# Patient Record
Sex: Male | Born: 1980 | Race: White | Hispanic: No | Marital: Single | Smoking: Current every day smoker
Health system: Southern US, Community
[De-identification: ages and names within clinical notes are randomized; demographics above are authoritative.]

---

## 2016-09-30 ENCOUNTER — Encounter (HOSPITAL_COMMUNITY): Payer: Self-pay

## 2016-09-30 ENCOUNTER — Emergency Department (HOSPITAL_COMMUNITY): Payer: Self-pay

## 2016-09-30 ENCOUNTER — Emergency Department (HOSPITAL_COMMUNITY)
Admission: EM | Admit: 2016-09-30 | Discharge: 2016-09-30 | Disposition: A | Discharge status: Home / Self Care | Payer: Self-pay | Attending: Emergency Medicine | Admitting: Emergency Medicine

## 2016-09-30 DIAGNOSIS — S46912A Strain of unspecified muscle, fascia and tendon at shoulder and upper arm level, left arm, initial encounter: Secondary | ICD-10-CM | POA: Insufficient documentation

## 2016-09-30 DIAGNOSIS — S60221A Contusion of right hand, initial encounter: Secondary | ICD-10-CM | POA: Insufficient documentation

## 2016-09-30 DIAGNOSIS — F17200 Nicotine dependence, unspecified, uncomplicated: Secondary | ICD-10-CM | POA: Insufficient documentation

## 2016-09-30 DIAGNOSIS — Y999 Unspecified external cause status: Secondary | ICD-10-CM | POA: Insufficient documentation

## 2016-09-30 DIAGNOSIS — Y92008 Other place in unspecified non-institutional (private) residence as the place of occurrence of the external cause: Secondary | ICD-10-CM | POA: Insufficient documentation

## 2016-09-30 DIAGNOSIS — Y9389 Activity, other specified: Secondary | ICD-10-CM | POA: Insufficient documentation

## 2016-09-30 DIAGNOSIS — W1789XA Other fall from one level to another, initial encounter: Secondary | ICD-10-CM | POA: Insufficient documentation

## 2016-09-30 MED ORDER — TRAMADOL HCL 50 MG PO TABS: 50.0000 mg | ORAL_TABLET | Freq: Four times a day (QID) | ORAL | 0 refills | Status: AC | PRN

## 2016-09-30 MED ORDER — OXYCODONE-ACETAMINOPHEN 5-325 MG PO TABS
1.0000 | ORAL_TABLET | Freq: Once | ORAL | Status: AC
  Administered 2016-09-30: 1 via ORAL
  Filled 2016-09-30: qty 1

## 2016-09-30 MED ORDER — OXYCODONE-ACETAMINOPHEN 5-325 MG PO TABS: 1.0000 | ORAL_TABLET | Freq: Once | ORAL | Status: DC

## 2016-09-30 NOTE — ED Provider Notes (Signed)
[facility]EM[facility] DEPT Provider Note   CSN: [id number] Arrival date & time: 09/30/16  0429     History   Chief Complaint Chief Complaint  Patient presents with  . Fall    shoulder/hand pain    HPI Gerald Hernandez is a 36 y.o. male.  Patient presents to the emergency department for evaluation of injuries from a fall. Patient reports that he was on his porch, leaned up against the railing which broke and he fell to the ground. Patient complaining of severe left shoulder pain, severe right hand pain. No head injury or loss of consciousness. No neck or back pain.      History reviewed. No pertinent past medical history.  There are no active problems to display for this patient.   History reviewed. No pertinent surgical history.     Home Medications    Prior to Admission medications   Medication Sig Start Date End Date Taking? Authorizing Provider  traMADol (ULTRAM) 50 MG tablet Take 1 tablet (50 mg total) by mouth every 6 (six) hours as needed. 09/30/16   Gilda Crease, MD    Family History No family history on file.  Social History Social History  Substance Use Topics  . Smoking status: Current Every Day Smoker  . Smokeless tobacco: Never Used  . Alcohol use No     Allergies   Darvon [propoxyphene]; Other; Shellfish allergy; and Toradol [ketorolac tromethamine]   Review of Systems Review of Systems  Musculoskeletal: Positive for arthralgias.  All other systems reviewed and are negative.    Physical Exam Updated Vital Signs BP (!) 185/92 (BP Location: Right Arm)   Pulse 87   Temp 98.3 F (36.8 C) (Oral)   Resp 19   Ht  (1.854 m)   Wt 200 lb (90.7 kg)   SpO2 98%   BMI 26.39 kg/m   Physical Exam  Constitutional: He is oriented to person, place, and time. He appears well-developed and well-nourished. No distress.  HENT:  Head: Normocephalic and atraumatic.  Right Ear: Hearing normal.  Left Ear: Hearing normal.  Nose: Nose normal.    Mouth/Throat: Oropharynx is clear and moist and mucous membranes are normal.  Eyes: Conjunctivae and EOM are normal. Pupils are equal, round, and reactive to light.  Neck: Normal range of motion. Neck supple.  Cardiovascular: Regular rhythm, S1 normal and S2 normal.  Exam reveals no gallop and no friction rub.   No murmur heard. Pulmonary/Chest: Effort normal and breath sounds normal. No respiratory distress. He exhibits no tenderness.  Abdominal: Soft. Normal appearance and bowel sounds are normal. There is no hepatosplenomegaly. There is no tenderness. There is no rebound, no guarding, no tenderness at McBurney's point and negative Murphy's sign. No hernia.  Musculoskeletal:       Left shoulder: He exhibits decreased range of motion and tenderness.       Right hand: He exhibits tenderness and swelling. He exhibits no deformity.  Neurological: He is alert and oriented to person, place, and time. He has normal strength. No cranial nerve deficit or sensory deficit. Coordination normal. GCS eye subscore is 4. GCS verbal subscore is 5. GCS motor subscore is 6.  Skin: Skin is warm, dry and intact. No rash noted. No cyanosis.  Psychiatric: He has a normal mood and affect. His speech is normal and behavior is normal. Thought content normal.  Nursing note and vitals reviewed.    ED Treatments / Results  Labs (all labs ordered are listed, but only abnormal  results are displayed) Labs Reviewed - No data to display  EKG  EKG Interpretation None       Radiology Dg Wrist Complete Right  Result Date: 09/30/2016 CLINICAL DATA:  Patient fell 4 feet from a porch yesterday at 6 p.m. Pain and swelling to the right wrist and hand. Left shoulder pain and unable to move the arm. EXAM: RIGHT WRIST - COMPLETE 3+ VIEW COMPARISON:  None. FINDINGS: There is no evidence of fracture or dislocation. There is no evidence of arthropathy or other focal bone abnormality. Soft tissues are unremarkable. IMPRESSION:  Negative. Electronically Signed   By: Burman Nieves M.D.   On: 09/30/2016 05:08   Dg Shoulder Left  Result Date: 09/30/2016 CLINICAL DATA:  Left shoulder pain and limited movement after fall from porch yesterday. EXAM: LEFT SHOULDER - 2+ VIEW COMPARISON:  None. FINDINGS: There is no evidence of fracture or dislocation. There is no evidence of arthropathy or other focal bone abnormality. Soft tissues are unremarkable. IMPRESSION: Negative. Electronically Signed   By: Burman Nieves M.D.   On: 09/30/2016 05:10   Dg Hand Complete Right  Result Date: 09/30/2016 CLINICAL DATA:  Right wrist and hand pain after fall from porch yesterday. EXAM: RIGHT HAND - COMPLETE 3+ VIEW COMPARISON:  None. FINDINGS: There is no evidence of fracture or dislocation. There is no evidence of arthropathy or other focal bone abnormality. Soft tissues are unremarkable. IMPRESSION: Negative. Electronically Signed   By: Burman Nieves M.D.   On: 09/30/2016 05:09    Procedures Procedures (including critical care time)  Medications Ordered in ED Medications  oxyCODONE-acetaminophen (PERCOCET/ROXICET) 5-325 MG per tablet 1 tablet (1 tablet Oral Given 09/30/16 0526)     Initial Impression / Assessment and Plan / ED Course  I have reviewed the triage vital signs and the nursing notes.  Pertinent labs & imaging results that were available during my care of the patient were reviewed by me and considered in my medical decision making (see chart for details).     Patient presents to the emergency department for evaluation of multiple injuries after a fall. Patient playing of severe hand and shoulder pain. Examination of the right hand revealed some swelling without obvious deformity. There is diffuse tenderness including at the area of the wrist. X-ray of hand and wrist, however, did not show any acute abnormality. Patient also complaining of left shoulder pain, x-ray negative. Was no other evidence of injury on  examination.  Final Clinical Impressions(s) / ED Diagnoses   Final diagnoses:  Contusion of right hand, initial encounter  Shoulder strain, left, initial encounter    New Prescriptions New Prescriptions   TRAMADOL (ULTRAM) 50 MG TABLET    Take 1 tablet (50 mg total) by mouth every 6 (six) hours as needed.     Gilda Crease, MD 09/30/16 [phone]

## 2016-09-30 NOTE — ED Triage Notes (Signed)
Pt states he fell off the porch last night approx 6 pm and fell onto his right hand and also injured his left shoulder

## 2018-03-20 IMAGING — DX DG WRIST COMPLETE 3+V*R*
4 series · 4 of 4 positions shown · non-contrast
Comparison: None.

CLINICAL DATA: Patient fell 4 feet from a porch yesterday at 6
p.m.. Pain and swelling to the right wrist and hand. Left shoulder
pain and unable to move the arm.

EXAM:
RIGHT WRIST - COMPLETE 3+ VIEW

[wrist pa]
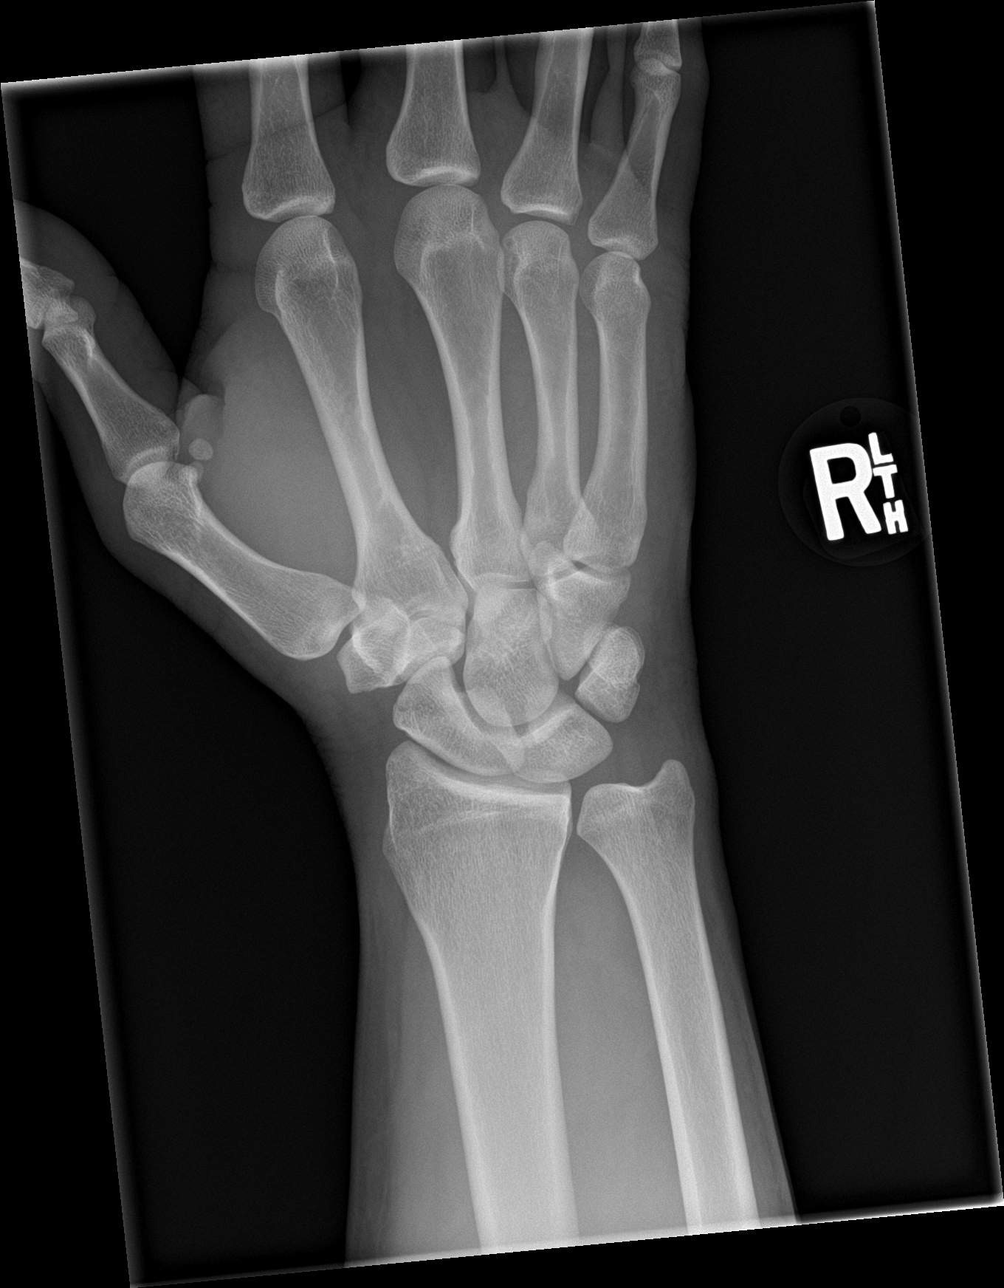

[wrist obl]
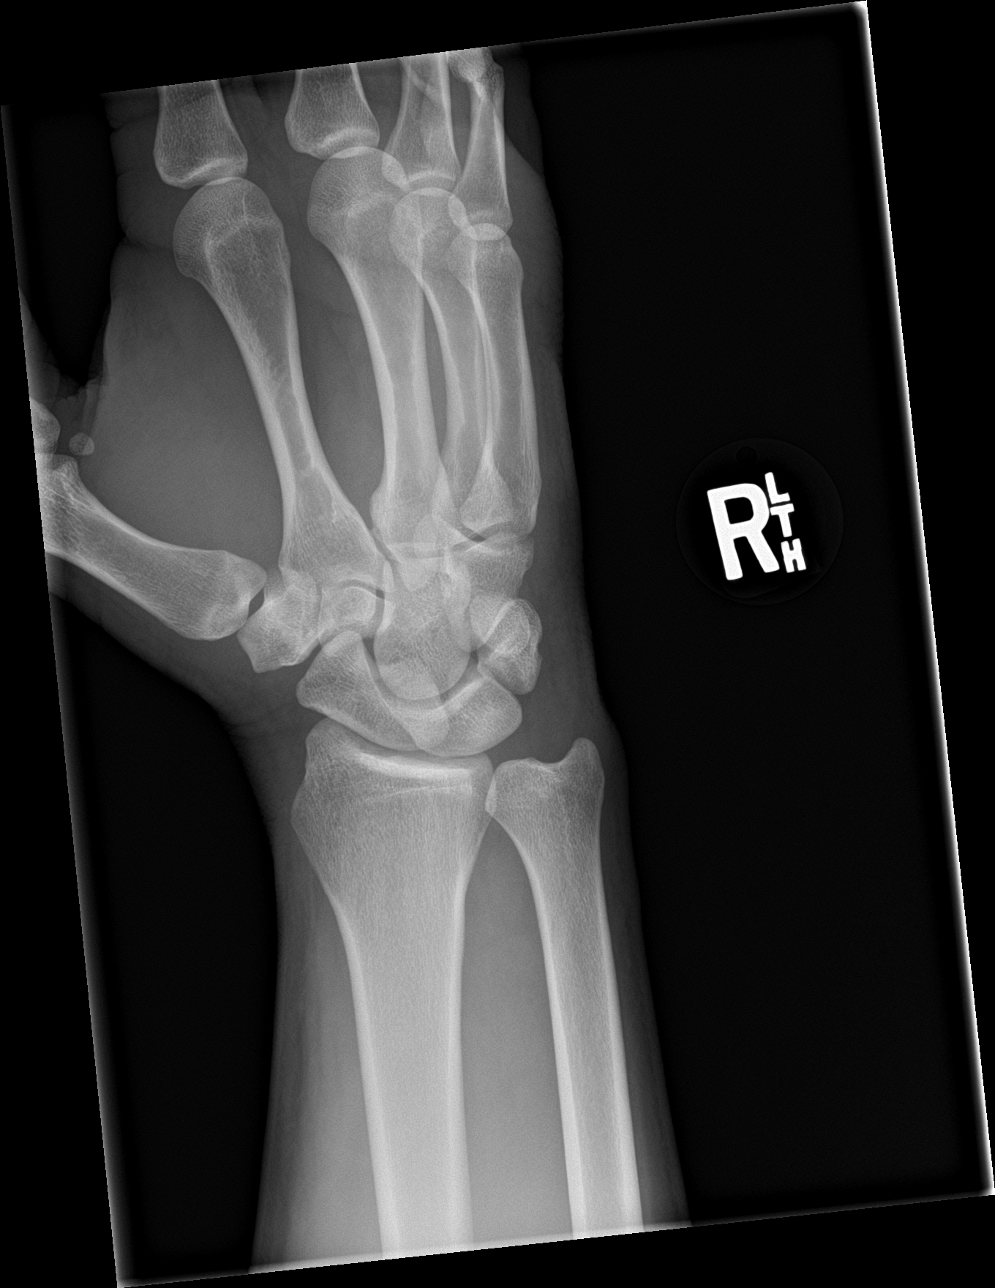

[wrist lat]
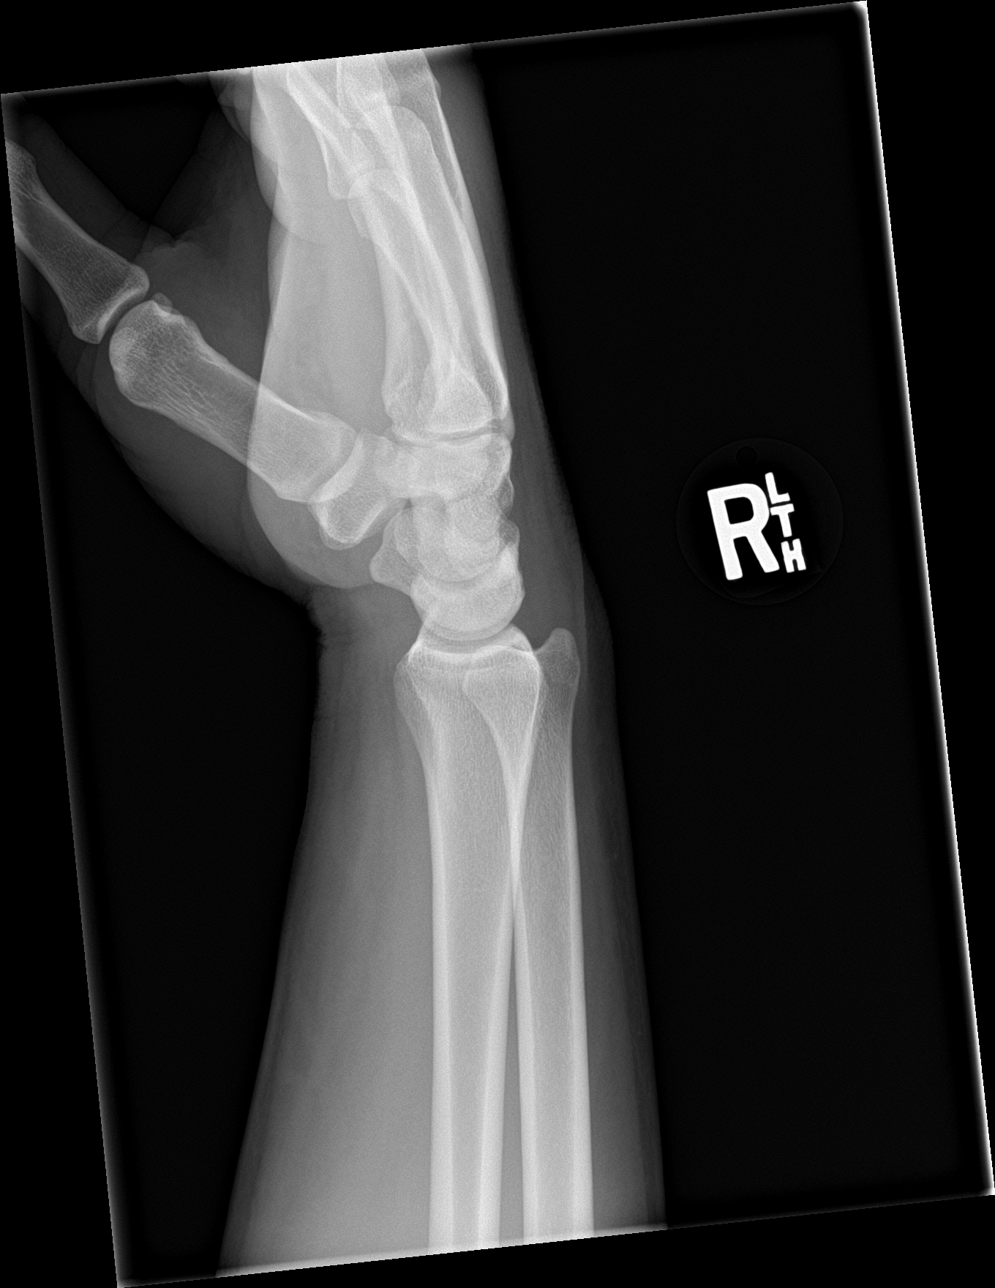

[wrist navicular]
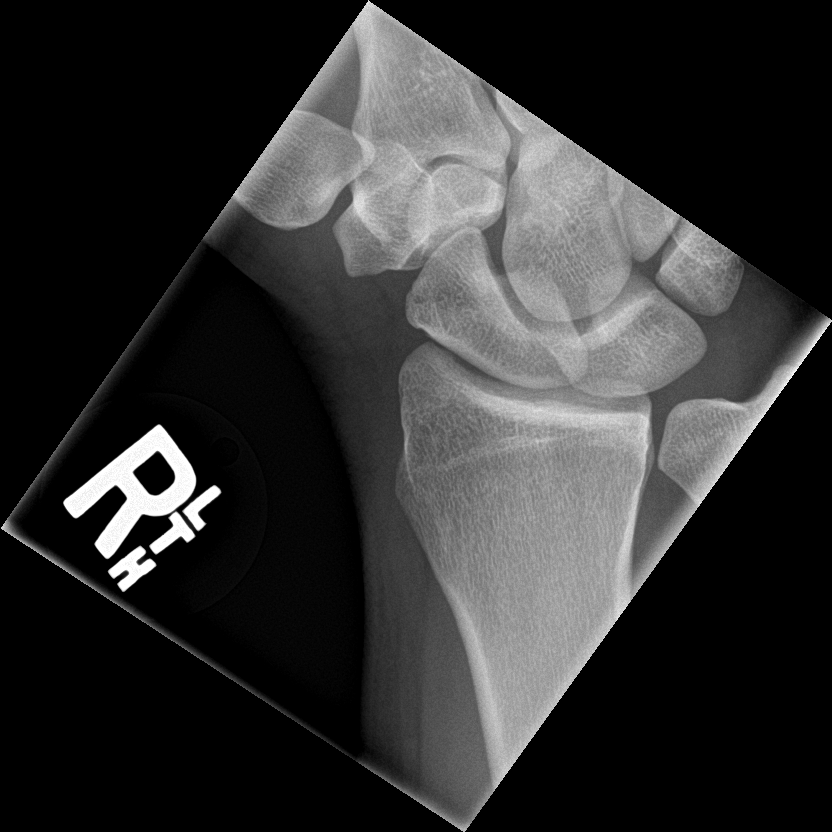

[4 of 4 positions shown; findings below may reference images not displayed]

FINDINGS: There is no evidence of fracture or dislocation. There is no
evidence of arthropathy or other focal bone abnormality. Soft
tissues are unremarkable.
IMPRESSION: Negative.

## 2018-03-20 IMAGING — DX DG HAND COMPLETE 3+V*R*
3 series · 3 of 3 positions shown · non-contrast
Comparison: None.

CLINICAL DATA: Right wrist and hand pain after fall from porch
yesterday.

EXAM:
RIGHT HAND - COMPLETE 3+ VIEW

[hand pa]
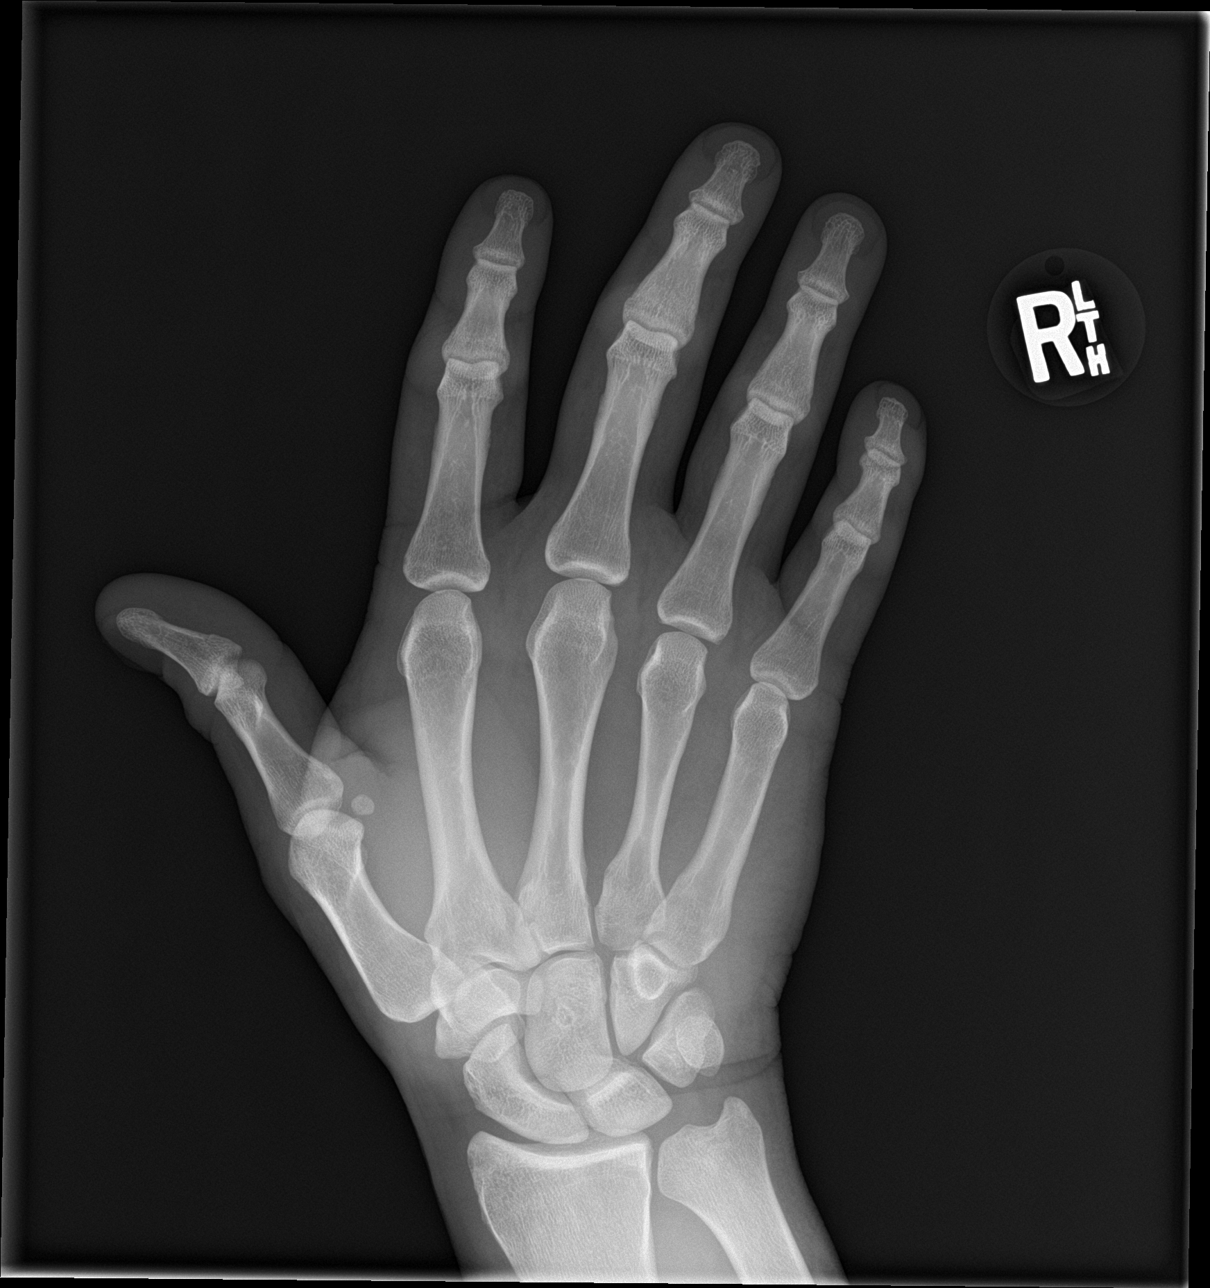

[hand obl]
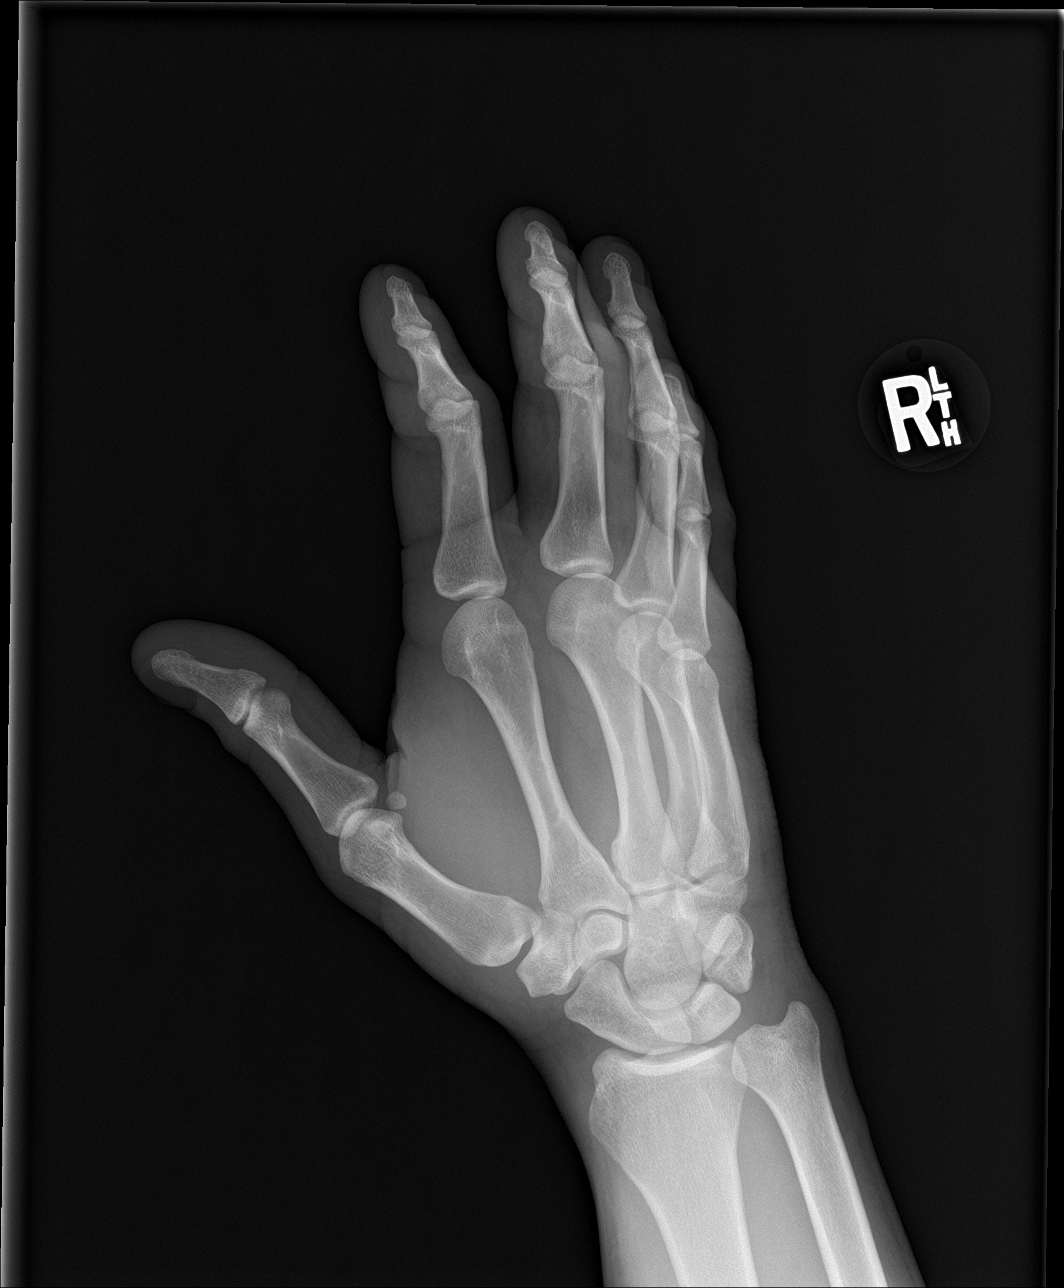

[hand lat]
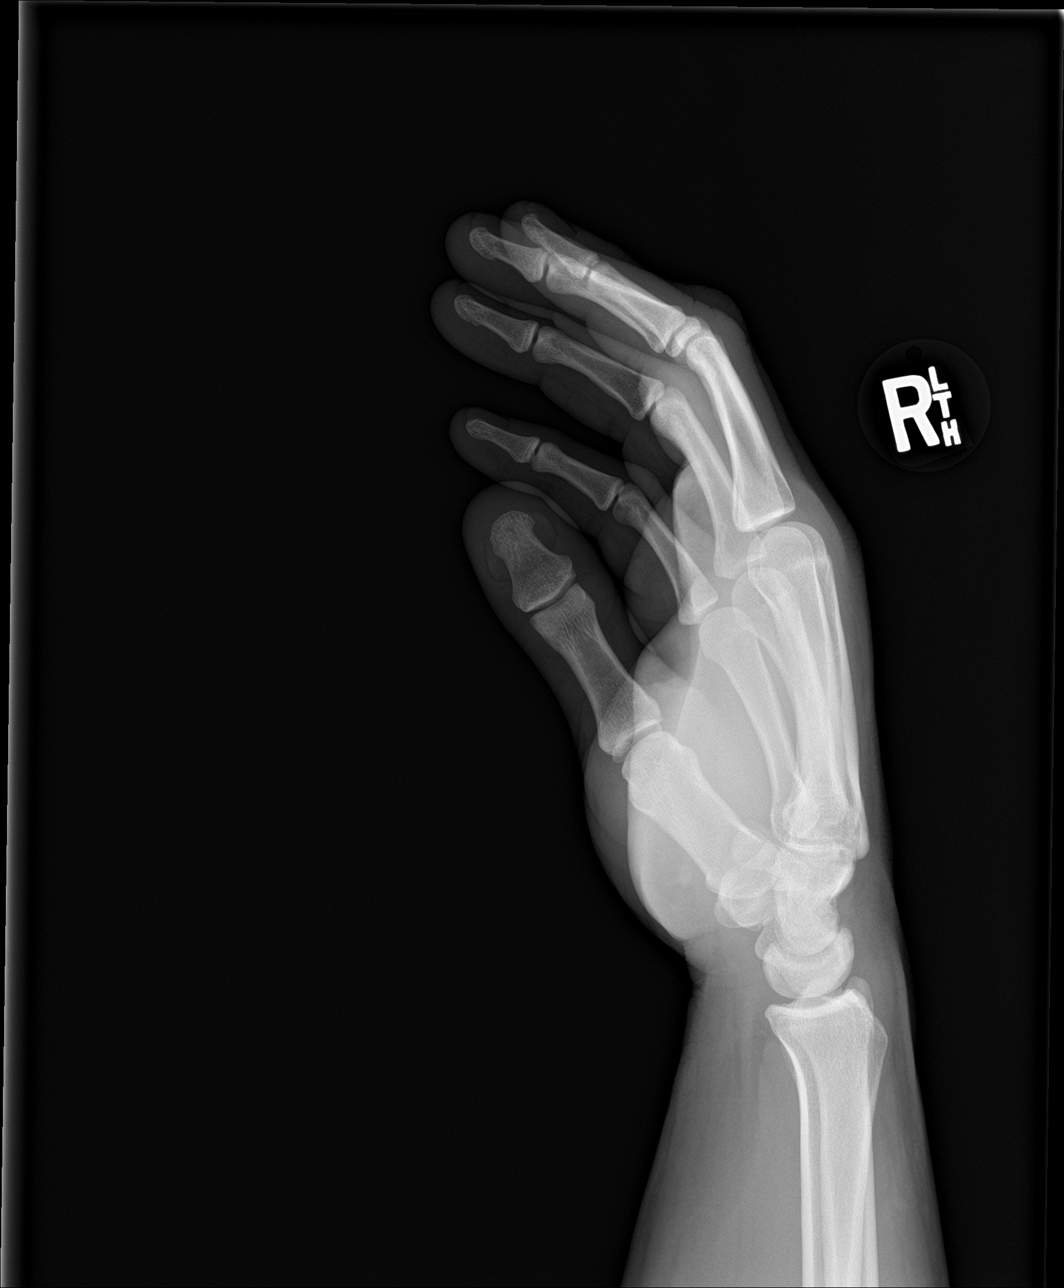

[3 of 3 positions shown; findings below may reference images not displayed]

FINDINGS: There is no evidence of fracture or dislocation. There is no
evidence of arthropathy or other focal bone abnormality. Soft
tissues are unremarkable.
IMPRESSION: Negative.
